# Patient Record
Sex: Male | Born: 2000 | Race: White | Hispanic: No | Marital: Single | Smoking: Never smoker
Health system: Southern US, Community
[De-identification: ages and names within clinical notes are randomized; demographics above are authoritative.]

---

## 2020-07-07 ENCOUNTER — Emergency Department
Admission: EM | Admit: 2020-07-07 | Discharge: 2020-07-07 | Disposition: A | Discharge status: Home / Self Care | Payer: 59 | Attending: Emergency Medicine | Admitting: Emergency Medicine

## 2020-07-07 ENCOUNTER — Emergency Department: Payer: 59

## 2020-07-07 ENCOUNTER — Other Ambulatory Visit: Payer: Self-pay

## 2020-07-07 DIAGNOSIS — M25512 Pain in left shoulder: Secondary | ICD-10-CM | POA: Insufficient documentation

## 2020-07-07 LAB — COMPREHENSIVE METABOLIC PANEL
ALT: 22 U/L (ref 0–44)
AST: 24 U/L (ref 15–41)
Albumin: 4.6 g/dL (ref 3.5–5.0)
Alkaline Phosphatase: 80 U/L (ref 38–126)
Anion gap: 12 (ref 5–15)
BUN: 12 mg/dL (ref 6–20)
CO2: 23 mmol/L (ref 22–32)
Calcium: 9.6 mg/dL (ref 8.9–10.3)
Chloride: 104 mmol/L (ref 98–111)
Creatinine, Ser: 0.91 mg/dL (ref 0.61–1.24)
GFR, Estimated: >60 mL/min (ref >60)
Glucose, Bld: 124 mg/dL — ABNORMAL HIGH (ref 70–99)
Potassium: 3.7 mmol/L (ref 3.5–5.1)
Sodium: 139 mmol/L (ref 135–145)
Total Bilirubin: 1 mg/dL (ref 0.3–1.2)
Total Protein: 7.6 g/dL (ref 6.5–8.1)

## 2020-07-07 LAB — CBC WITH DIFFERENTIAL/PLATELET
Abs Immature Granulocytes: 0.03 10*3/uL (ref 0.00–0.07)
Basophils Absolute: 0.1 10*3/uL (ref 0.0–0.1)
Basophils Relative: 1 %
Eosinophils Absolute: 0.8 10*3/uL — ABNORMAL HIGH (ref 0.0–0.5)
Eosinophils Relative: 11 %
HCT: 45 % (ref 39.0–52.0)
Hemoglobin: 16.1 g/dL (ref 13.0–17.0)
Immature Granulocytes: 0 %
Lymphocytes Relative: 34 %
Lymphs Abs: 2.3 10*3/uL (ref 0.7–4.0)
MCH: 30.9 pg (ref 26.0–34.0)
MCHC: 35.8 g/dL (ref 30.0–36.0)
MCV: 86.4 fL (ref 80.0–100.0)
Monocytes Absolute: 0.6 10*3/uL (ref 0.1–1.0)
Monocytes Relative: 9 %
Neutro Abs: 3 10*3/uL (ref 1.7–7.7)
Neutrophils Relative %: 45 %
Platelets: 203 10*3/uL (ref 150–400)
RBC: 5.21 MIL/uL (ref 4.22–5.81)
RDW: 12.5 % (ref 11.5–15.5)
WBC: 6.8 10*3/uL (ref 4.0–10.5)
nRBC: 0 % (ref 0.0–0.2)

## 2020-07-07 LAB — TROPONIN I (HIGH SENSITIVITY): Troponin I (High Sensitivity): <2 ng/L (ref <18)

## 2020-07-07 NOTE — ED Triage Notes (Signed)
Pt c/o left shoulder pain radiating into upper back that started today. Pt denies chest pain, sob, or dizziness associated with pain. Pt states he knew this was a sign of a heart attack and wanted to be seen. Pt denies any cardiac hx. Pt denies injury to shoulder

## 2020-07-07 NOTE — ED Provider Notes (Signed)
[facility] Emergency Department Provider Note   ____________________________________________   Event Date/Time   First MD Initiated Contact with Patient 07/07/20 1925     (approximate)  I have reviewed the triage vital signs and the nursing notes.   HISTORY  Chief Complaint Shoulder Pain    HPI Hunter Williamson is a 20 y.o. male with no stated past medical history who presents for left shoulder pain that began when he awoke this morning.  Patient describes 3/10 aching pain to the left upper arm and shoulder that does not radiate and has no exacerbating or relieving factors.  Patient specifically states that this pain does not worsen with any movement at the shoulder or lifting.  Patient currently denies any vision changes, tinnitus, difficulty speaking, facial droop, sore throat, chest pain, shortness of breath, abdominal pain, nausea/vomiting/diarrhea, dysuria, or weakness/numbness/paresthesias in any extremity         History reviewed. No pertinent past medical history.  There are no problems to display for this patient.   History reviewed. No pertinent surgical history.  Prior to Admission medications   Not on File    Allergies Patient has no allergy information on record.  No family history on file.  Social History Social History   Tobacco Use  . Smoking status: Never Smoker  . Smokeless tobacco: Never Used  Substance Use Topics  . Alcohol use: Yes  . Drug use: Not Currently    Review of Systems Constitutional: No fever/chills Eyes: No visual changes. ENT: No sore throat. Cardiovascular: Denies chest pain. Respiratory: Denies shortness of breath. Gastrointestinal: No abdominal pain.  No nausea, no vomiting.  No diarrhea. Genitourinary: Negative for dysuria. Musculoskeletal: Positive for acute left shoulder and arm pain Skin: Negative for rash. Neurological: Negative for headaches, weakness/numbness/paresthesias in any  extremity Psychiatric: Negative for suicidal ideation/homicidal ideation   ____________________________________________   PHYSICAL EXAM:  VITAL SIGNS: ED Triage Vitals  Enc Vitals Group     BP 07/07/20 1924 135/90     Pulse Rate 07/07/20 1924 89     Resp 07/07/20 1924 18     Temp 07/07/20 1924 97.8 F (36.6 C)     Temp Source 07/07/20 1924 Oral     SpO2 07/07/20 1924 100 %     Weight 07/07/20 1925 160 lb (72.6 kg)     Height 07/07/20 1925 6' (1.829 m)     Head Circumference --      Peak Flow --      Pain Score 07/07/20 1925 3     Pain Loc --      Pain Edu? --      Excl. in GC? --    Constitutional: Alert and oriented. Well appearing and in no acute distress. Eyes: Conjunctivae are normal. PERRL. Head: Atraumatic. Nose: No congestion/rhinnorhea. Mouth/Throat: Mucous membranes are moist. Neck: No stridor Cardiovascular: Grossly normal heart sounds.  Good peripheral circulation. Respiratory: Normal respiratory effort.  No retractions. Gastrointestinal: Soft and nontender. No distention. Musculoskeletal: No obvious deformities.  Full range of motion without pain at the left shoulder Neurologic:  Normal speech and language. No gross focal neurologic deficits are appreciated. Skin:  Skin is warm and dry. No rash noted. Psychiatric: Mood and affect are normal. Speech and behavior are normal.  ____________________________________________   LABS (all labs ordered are listed, but only abnormal results are displayed)  Labs Reviewed  COMPREHENSIVE METABOLIC PANEL - Abnormal; Notable for the following components:      Result Value  Glucose, Bld 124 (*)    All other components within normal limits  CBC WITH DIFFERENTIAL/PLATELET - Abnormal; Notable for the following components:   Eosinophils Absolute 0.8 (*)    All other components within normal limits  TROPONIN I (HIGH SENSITIVITY)   ____________________________________________  EKG  ED ECG REPORT I, Merwyn Katos,  the attending physician, personally viewed and interpreted this ECG.  Date: 07/07/2020 EKG Time: 1924 Rate: 88 Rhythm: normal sinus rhythm QRS Axis: normal Intervals: normal ST/T Wave abnormalities: normal Narrative Interpretation: no evidence of acute ischemia  ____________________________________________  RADIOLOGY  ED MD interpretation: One-view portable chest x-ray shows no evidence of acute abnormalities including no pneumonia, pneumothorax, or widened mediastinum  Official radiology report(s): No results found.  ____________________________________________   PROCEDURES  Procedure(s) performed (including Critical Care):  Procedures   ____________________________________________   INITIAL IMPRESSION / ASSESSMENT AND PLAN / ED COURSE  As part of my medical decision making, I reviewed the following data within the electronic MEDICAL RECORD NUMBER Nursing notes reviewed and incorporated, Labs reviewed, EKG interpreted, Old chart reviewed, Radiograph reviewed and Notes from prior ED visits reviewed and incorporated        20 year old male presents for left shoulder pain Given history, exam and workup I have low suspicion for fracture, dislocation, significant ligamentous injury, septic arthritis, gout flare, new autoimmune arthropathy, or gonococcal arthropathy.  Interventions: One-view portable chest x-ray Disposition: Discharge home with strict return precautions and instructions for prompt primary care follow up in the next week.      ____________________________________________   FINAL CLINICAL IMPRESSION(S) / ED DIAGNOSES  Final diagnoses:  Acute pain of left shoulder     ED Discharge Orders    None       Note:  This document was prepared using Dragon voice recognition software and may include unintentional dictation errors.   Merwyn Katos, MD 07/12/20 [id number]

## 2021-11-21 ENCOUNTER — Other Ambulatory Visit: Payer: Self-pay

## 2021-11-21 ENCOUNTER — Emergency Department
Admission: EM | Admit: 2021-11-21 | Discharge: 2021-11-21 | Disposition: A | Discharge status: Home / Self Care | Payer: 59 | Attending: Emergency Medicine | Admitting: Emergency Medicine

## 2021-11-21 ENCOUNTER — Encounter: Payer: Self-pay | Admitting: Emergency Medicine

## 2021-11-21 DIAGNOSIS — S61411A Laceration without foreign body of right hand, initial encounter: Secondary | ICD-10-CM | POA: Diagnosis not present

## 2021-11-21 DIAGNOSIS — W268XXA Contact with other sharp object(s), not elsewhere classified, initial encounter: Secondary | ICD-10-CM | POA: Insufficient documentation

## 2021-11-21 DIAGNOSIS — S6991XA Unspecified injury of right wrist, hand and finger(s), initial encounter: Secondary | ICD-10-CM | POA: Diagnosis present

## 2021-11-21 MED ORDER — CEPHALEXIN 500 MG PO CAPS: 1000.0000 mg | ORAL_CAPSULE | Freq: Two times a day (BID) | ORAL | 0 refills | Status: AC

## 2021-11-21 MED ORDER — LIDOCAINE-EPINEPHRINE (PF) 2 %-1:200000 IJ SOLN
10.0000 mL | Freq: Once | INTRAMUSCULAR | Status: AC
  Administered 2021-11-21: 10 mL
  Filled 2021-11-21: qty 20

## 2021-11-21 NOTE — ED Provider Notes (Signed)
[facility] Provider Note    Event Date/Time   First MD Initiated Contact with Patient 11/21/21 1527     (approximate)   History   Chief Complaint Laceration   HPI Hunter Williamson is a 21 y.o. male, no significant medical history, presents emergency department for evaluation of laceration to the right hand.  Patient states that he was playing with a steel rod that had broken in half and accidentally cut his right hand on the end of it.  Bleeding controlled with direct pressure.  Denies any other injuries at this time.  He is up-to-date on his tetanus.   History Limitations: No limitations.        Physical Exam  Triage Vital Signs: ED Triage Vitals  Enc Vitals Group     BP 11/21/21 1405 (!) 113/56     Pulse Rate 11/21/21 1405 78     Resp 11/21/21 1405 16     Temp 11/21/21 1405 97.8 F (36.6 C)     Temp Source 11/21/21 1405 Oral     SpO2 11/21/21 1405 99 %     Weight --      Height --      Head Circumference --      Peak Flow --      Pain Score 11/21/21 1400 5     Pain Loc --      Pain Edu? --      Excl. in GC? --     Most recent vital signs: Vitals:   11/21/21 1405  BP: (!) 113/56  Pulse: 78  Resp: 16  Temp: 97.8 F (36.6 C)  SpO2: 99%    General: Awake, NAD.  Skin: Warm, dry. No rashes or lesions.  Eyes: PERRL. Conjunctivae normal.  CV: Good peripheral perfusion.  Resp: Normal effort.  Abd: Soft, non-tender. No distention.  Neuro: At baseline. No gross neurological deficits.  Musculoskeletal: Normal ROM of all extremities.   Focused Exam: 4 cm linear laceration to the palmar aspect of the right hand just over top of the second metacarpal.  No exposed tendons.  Normal range of motion of the right hand and all digits, including flexion and extension.  Sensation intact along the hand and digits.  Normal cap refill distally.  No foreign bodies present.  No pulsatile bleeding  Physical Exam    ED Results / Procedures /  Treatments  Labs (all labs ordered are listed, but only abnormal results are displayed) Labs Reviewed - No data to display   EKG N/A.   RADIOLOGY  ED Provider Interpretation: N/A.  No results found.  PROCEDURES:  Critical Care performed: A.  Marland Kitchen.Laceration Repair  Date/Time: 11/21/2021 4:32 PM  Performed by: Varney Daily, PA Authorized by: Varney Daily, PA   Consent:    Consent obtained:  Verbal   Consent given by:  Patient   Risks, benefits, and alternatives were discussed: yes     Risks discussed:  Infection, need for additional repair, nerve damage, pain, retained foreign body, tendon damage and vascular damage   Alternatives discussed:  No treatment Universal protocol:    Patient identity confirmed:  Verbally with patient Anesthesia:    Anesthesia method:  Local infiltration   Local anesthetic:  Lidocaine 2% WITH epi Laceration details:    Location:  Hand   Hand location:  R palm   Length (cm):  4   Depth (mm):  2 Pre-procedure details:    Preparation:  Patient was prepped and draped  in usual sterile fashion Exploration:    Hemostasis achieved with:  Direct pressure   Wound exploration: wound explored through full range of motion and entire depth of wound visualized     Wound extent: no foreign bodies/material noted, no muscle damage noted, no nerve damage noted, no tendon damage noted, no underlying fracture noted and no vascular damage noted   Treatment:    Area cleansed with:  Soap and water   Amount of cleaning:  Extensive   Irrigation solution:  Tap water   Irrigation volume:  3000 ml   Irrigation method:  Tap Skin repair:    Repair method:  Sutures   Suture size:  5-0   Suture material:  Nylon   Suture technique:  Simple interrupted   Number of sutures:  6 Approximation:    Approximation:  Close Repair type:    Repair type:  Simple Post-procedure details:    Dressing:  Sterile dressing and tube gauze   Procedure completion:   Tolerated well, no immediate complications     MEDICATIONS ORDERED IN ED: Medications  lidocaine-EPINEPHrine (XYLOCAINE W/EPI) 2 %-1:200000 (PF) injection 10 mL (10 mLs Infiltration Given 11/21/21 1552)     IMPRESSION / MDM / ASSESSMENT AND PLAN / ED COURSE  I reviewed the triage vital signs and the nursing notes.                              Differential diagnosis includes, but is not limited to, hand laceration, flexor tendon injury, foreign body, cellulitis,   Assessment/Plan Patient presents with 4 cm laceration to the palmar aspect of the right hand.  No signs of vascular or flexor tendon injury.  No foreign bodies.  No signs of infectious etiology.  No indications for imaging at this time.  Repaired the laceration with nonabsorbable sutures.  Patient tolerated the procedure well with no immediate complications.  Given the mechanism, will provide him with antibiotic prophylaxis with prescription for cephalexin.  Instructed him to return back to the emergency department in 7 to 10 days for suture removal.  Will discharge.  Provided the patient with anticipatory guidance, return precautions, and educational material. Encouraged the patient to return to the emergency department at any time if they begin to experience any new or worsening symptoms. Patient expressed understanding and agreed with the plan.   Patient's presentation is most consistent with acute, uncomplicated illness.       FINAL CLINICAL IMPRESSION(S) / ED DIAGNOSES   Final diagnoses:  Laceration of right hand, foreign body presence unspecified, initial encounter     Rx / DC Orders   ED Discharge Orders          Ordered    cephALEXin (KEFLEX) 500 MG capsule  2 times daily        11/21/21 1625             Note:  This document was prepared using Dragon voice recognition software and may include unintentional dictation errors.   Varney Daily, [place] 11/21/21 1633    Pilar Jarvis, MD 11/21/21  2010

## 2021-11-21 NOTE — ED Triage Notes (Signed)
Pt to ED via POV, pt states that he cut his right hand on a piece of metal. Laceration is on right hand. Laceration is actively bleeding, dressing placed on pts hand by this RN

## 2021-11-21 NOTE — Discharge Instructions (Addendum)
-  Take all of your antibiotics as prescribed.  -You may take Tylenol/ibuprofen as needed for pain.  -Please return to the emergency department in 7 to 10 days for suture removal.  You may also go to urgent care, minute clinic, or your regular doctor for suture removal.  -Avoid any excessive movements with your hand in order to prevent suture rupture.  -Return to the emergency department anytime if you begin to experience any new or worsening symptoms.

## 2021-11-30 ENCOUNTER — Ambulatory Visit (INDEPENDENT_AMBULATORY_CARE_PROVIDER_SITE_OTHER): Payer: 59 | Admitting: Adult Health

## 2021-11-30 ENCOUNTER — Encounter: Payer: Self-pay | Admitting: Adult Health

## 2021-11-30 ENCOUNTER — Other Ambulatory Visit: Payer: Self-pay

## 2021-11-30 VITALS — BP 126/64 | HR 75 | Temp 97.8°F | Ht 71.5 in | Wt 167.0 lb

## 2021-11-30 DIAGNOSIS — Z4802 Encounter for removal of sutures: Secondary | ICD-10-CM | POA: Diagnosis not present

## 2021-11-30 NOTE — Progress Notes (Signed)
[facility] [address] [place], [place] [address] Phone: [phone] Fax: [phone]   Office Visit Note  Patient Name: Hunter Williamson  Date of Birth:2001-02-13  Med Rec number [id number]  Date of Service: 11/30/2021  Patient has no known allergies.  Chief Complaint  Patient presents with   Suture / Staple Removal     Suture / Staple Removal    Patient presents with 6 interrupted sutures in the palmer surface of his right hand.  They were placed 9 days ago.  He denies any issues at this time.   Current Medication:  No outpatient encounter medications on file as of 11/30/2021.   No facility-administered encounter medications on file as of 11/30/2021.      Medical History: History reviewed. No pertinent past medical history.   Vital Signs: BP 126/64 (BP Location: Right Arm, Patient Position: Sitting)   Pulse 75   Temp 97.8 F (36.6 C) (Tympanic)   Ht 5' 11.5" (1.816 m)   Wt 167 lb (75.8 kg)   SpO2 98%   BMI 22.97 kg/m    Review of Systems  Skin:        Laceration right hand, palmer surface.  Sutures intact    Physical Exam Vitals and nursing note reviewed.  Constitutional:      Appearance: Normal appearance.  Skin:    Comments: Sutured wound to right hand palmer surface. Wound appears closed, no drainage or redness.   Neurological:     Mental Status: He is alert.     Assessment/Plan: 1. Encounter for removal of sutures 6 sutures removed.  Wound is mildly dehissed, steri strips placed at this time using benzoin.  Discussed after care, and not using his right hand (when possible) to keep the wound closed for the next week.      General Counseling: Hunter Williamson understanding of the findings of todays visit and agrees with plan of treatment. I have discussed any further diagnostic evaluation that may be needed or ordered today. We also reviewed his medications today. he has been encouraged to call the office with any questions or  concerns that should arise related to todays visit.   No orders of the defined types were placed in this encounter.   No orders of the defined types were placed in this encounter.   Time spent:20 Minutes    Johnna Acosta AGNP-C Nurse Practitioner

## 2022-01-13 ENCOUNTER — Ambulatory Visit
Admission: RE | Admit: 2022-01-13 | Discharge: 2022-01-13 | Disposition: A | Discharge status: Home / Self Care | Payer: 59 | Source: Ambulatory Visit | Attending: Emergency Medicine | Admitting: Emergency Medicine

## 2022-01-13 ENCOUNTER — Ambulatory Visit (INDEPENDENT_AMBULATORY_CARE_PROVIDER_SITE_OTHER): Payer: 59

## 2022-01-13 VITALS — BP 131/77 | HR 78 | Temp 98.4°F | Resp 17 | Ht 72.0 in | Wt 165.0 lb

## 2022-01-13 DIAGNOSIS — S9032XA Contusion of left foot, initial encounter: Secondary | ICD-10-CM

## 2022-01-13 DIAGNOSIS — R2242 Localized swelling, mass and lump, left lower limb: Secondary | ICD-10-CM | POA: Diagnosis not present

## 2022-01-13 NOTE — Discharge Instructions (Addendum)
The xray of your foot is normal.    Follow up with your primary care provider if your symptoms are not improving.

## 2022-01-13 NOTE — ED Triage Notes (Signed)
Patient to Urgent Care with complaints of a dark spot on the bottom of his left foot that appeared three days ago. Bruise present to arch of foot, reports he had been walking outside in only his socks prior to finding the area. Denies any pain, denies any known injury.

## 2022-01-13 NOTE — ED Provider Notes (Signed)
[facility]    CSN: [id number] Arrival date & time: 01/13/22  1344      History   Chief Complaint Chief Complaint  Patient presents with   Rash    Dark splotchy spot on bottom of foot. - Entered by patient    HPI Hunter Williamson is a 21 y.o. male.  Patient presents with 3-day history of discolored area on bottom of left foot.  The symptom started after he was walking outside with just socks on.  No known injury.  No pain, numbness, weakness, paresthesias, open wound, or other symptoms.  No treatment at home.  No pertinent medical history.   The history is provided by the patient.    History reviewed. No pertinent past medical history.  There are no problems to display for this patient.   History reviewed. No pertinent surgical history.     Home Medications    Prior to Admission medications   Not on File    Family History History reviewed. No pertinent family history.  Social History Social History   Tobacco Use   Smoking status: Never   Smokeless tobacco: Never  Vaping Use   Vaping Use: Never used  Substance Use Topics   Alcohol use: Yes   Drug use: Not Currently     Allergies   Patient has no known allergies.   Review of Systems Review of Systems  Constitutional:  Negative for chills and fever.  Musculoskeletal:  Negative for arthralgias and gait problem.  Skin:  Positive for color change.  Neurological:  Negative for weakness and numbness.  All other systems reviewed and are negative.    Physical Exam Triage Vital Signs ED Triage Vitals  Enc Vitals Group     BP 01/13/22 1351 131/77     Pulse Rate 01/13/22 1351 78     Resp 01/13/22 1351 17     Temp 01/13/22 1351 98.4 F (36.9 C)     Temp src --      SpO2 01/13/22 1351 96 %     Weight 01/13/22 1351 165 lb (74.8 kg)     Height 01/13/22 1351 6' (1.829 m)     Head Circumference --      Peak Flow --      Pain Score 01/13/22 1349 0     Pain Loc --      Pain Edu? --       Excl. in [place]? --    No data found.  Updated Vital Signs BP 131/77   Pulse 78   Temp 98.4 F (36.9 C)   Resp 17   Ht 6' (1.829 m)   Wt 165 lb (74.8 kg)   SpO2 96%   BMI 22.38 kg/m   Visual Acuity Right Eye Distance:   Left Eye Distance:   Bilateral Distance:    Right Eye Near:   Left Eye Near:    Bilateral Near:     Physical Exam Vitals and nursing note reviewed.  Constitutional:      General: He is not in acute distress.    Appearance: Normal appearance. He is well-developed. He is not ill-appearing.  HENT:     Mouth/Throat:     Mouth: Mucous membranes are moist.  Cardiovascular:     Rate and Rhythm: Normal rate and regular rhythm.     Heart sounds: Normal heart sounds.  Pulmonary:     Effort: Pulmonary effort is normal. No respiratory distress.     Breath sounds: Normal breath sounds.  Musculoskeletal:        General: Swelling present. No tenderness or deformity. Normal range of motion.     Cervical back: Neck supple.       Feet:  Skin:    General: Skin is warm and dry.     Capillary Refill: Capillary refill takes less than 2 seconds.     Findings: Bruising and rash present. No lesion.  Neurological:     General: No focal deficit present.     Mental Status: He is alert and oriented to person, place, and time.     Sensory: No sensory deficit.     Motor: No weakness.     Gait: Gait normal.  Psychiatric:        Mood and Affect: Mood normal.        Behavior: Behavior normal.           UC Treatments / Results  Labs (all labs ordered are listed, but only abnormal results are displayed) Labs Reviewed - No data to display  EKG   Radiology DG Foot Complete Left  Result Date: 01/13/2022 CLINICAL DATA:  Bruising and swelling along the plantar aspect of the foot. EXAM: LEFT FOOT - COMPLETE 3+ VIEW COMPARISON:  None Available. FINDINGS: There is no evidence of fracture or dislocation. There is no evidence of arthropathy or other focal bone abnormality.  Soft tissues are unremarkable. IMPRESSION: Negative. Electronically Signed   By: Paulina Fusi M.D.   On: 01/13/2022 14:34    Procedures Procedures (including critical care time)  Medications Ordered in UC Medications - No data to display  Initial Impression / Assessment and Plan / UC Course  I have reviewed the triage vital signs and the nursing notes.  Pertinent labs & imaging results that were available during my care of the patient were reviewed by me and considered in my medical decision making (see chart for details).    Contusion and swelling of left foot.  X-ray negative.  Discussed symptomatic treatment including rest, elevation, ice packs, ibuprofen.  Instructed patient to follow-up with his PCP if his symptoms are not improving.  Education provided on foot contusion.  Patient agrees to plan of care.  Final Clinical Impressions(s) / UC Diagnoses   Final diagnoses:  Localized swelling of left foot  Contusion of left foot, initial encounter     Discharge Instructions      The xray of your foot is normal.    Follow up with your primary care provider if your symptoms are not improving.        ED Prescriptions   None    PDMP not reviewed this encounter.   Mickie Bail, NP 01/13/22 [phone]

## 2022-04-13 IMAGING — DX DG CHEST 1V PORT
1 series · 1 of 1 positions shown · non-contrast
Comparison: None.

CLINICAL DATA: Left chest and shoulder pain

EXAM:
PORTABLE CHEST 1 VIEW

[chest ap]
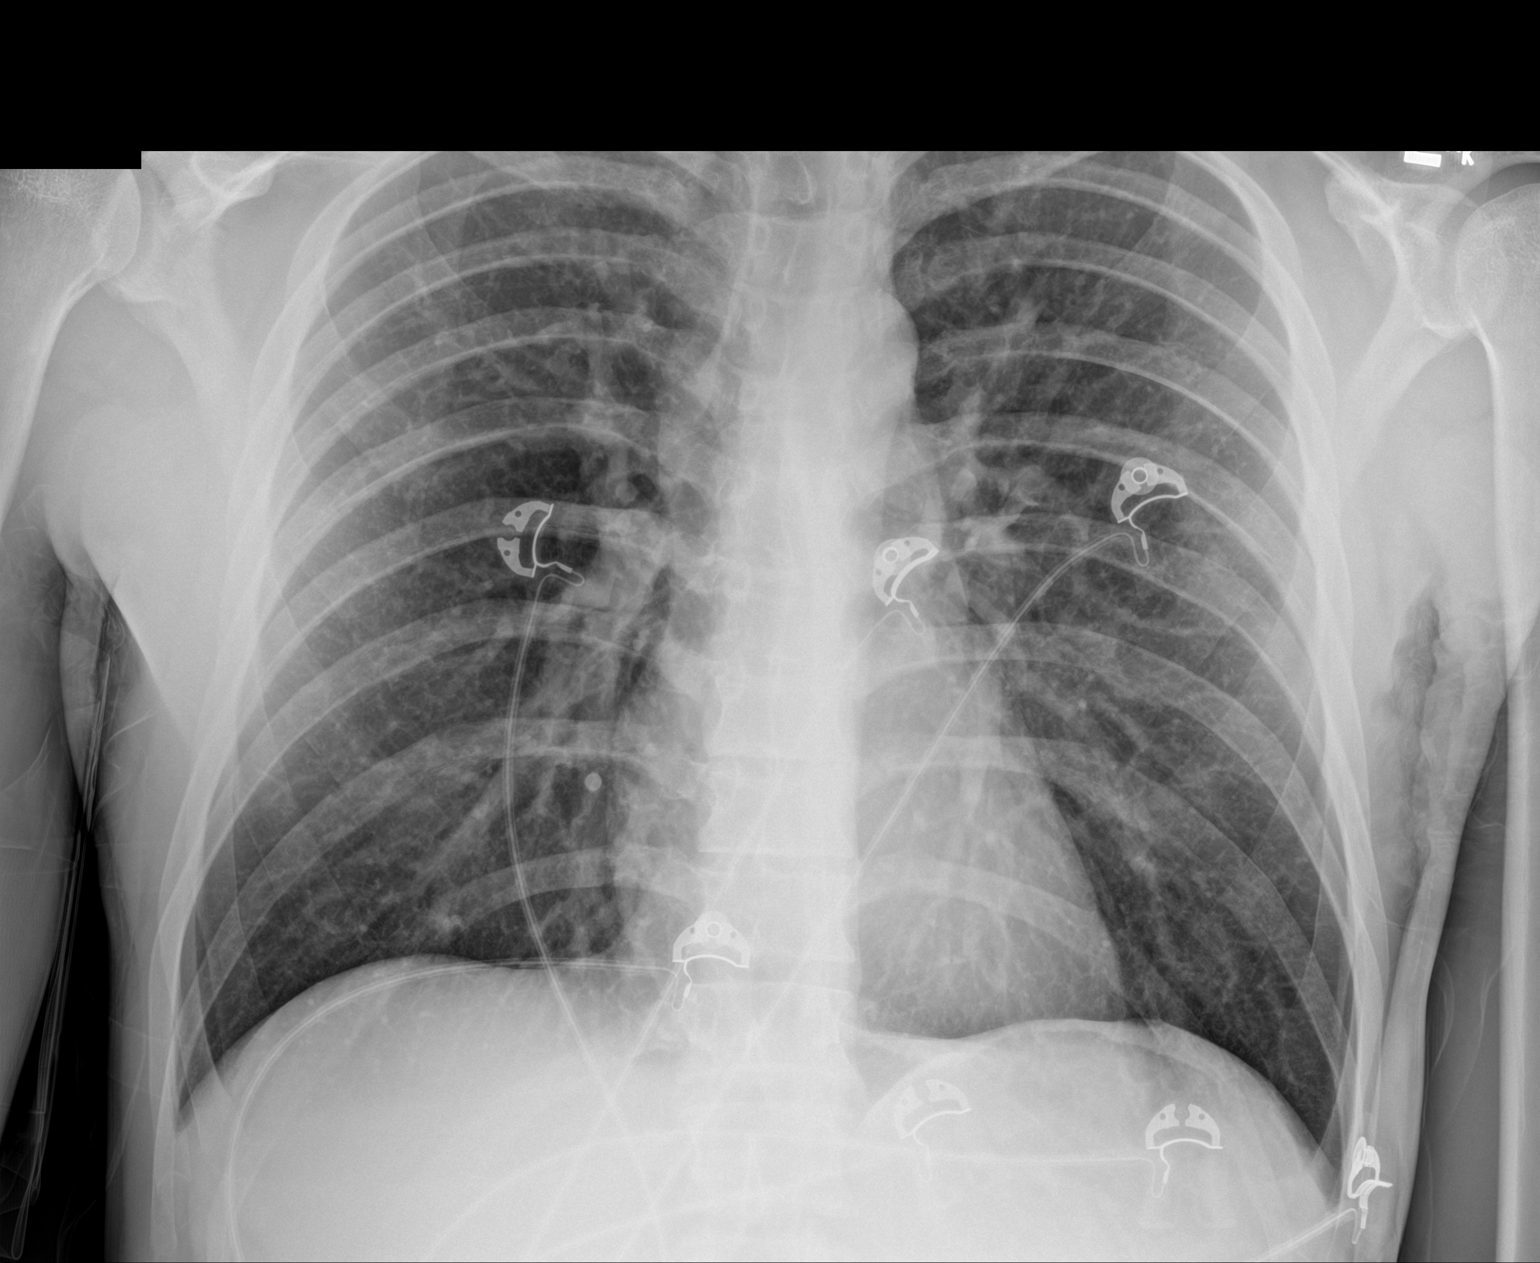

[1 of 1 positions shown; findings below may reference images not displayed]

FINDINGS: The heart size and mediastinal contours are within normal limits.
Both lungs are clear. The visualized skeletal structures are
unremarkable.
IMPRESSION: Negative.

## 2022-05-31 ENCOUNTER — Ambulatory Visit (INDEPENDENT_AMBULATORY_CARE_PROVIDER_SITE_OTHER): Payer: 59 | Admitting: Family Medicine

## 2022-05-31 ENCOUNTER — Encounter: Payer: Self-pay | Admitting: Family Medicine

## 2022-05-31 VITALS — BP 118/76 | HR 80 | Temp 97.9°F | Resp 18 | Ht 72.0 in | Wt 165.0 lb

## 2022-05-31 DIAGNOSIS — Z8709 Personal history of other diseases of the respiratory system: Secondary | ICD-10-CM

## 2022-05-31 DIAGNOSIS — B9789 Other viral agents as the cause of diseases classified elsewhere: Secondary | ICD-10-CM | POA: Diagnosis not present

## 2022-05-31 DIAGNOSIS — J988 Other specified respiratory disorders: Secondary | ICD-10-CM | POA: Diagnosis not present

## 2022-05-31 NOTE — Progress Notes (Signed)
[address] [place], [place] [address] Phone: [phone] Fax: [phone]   Office Visit Note  Patient Name: Hunter Williamson  Date of [id number]  Med Rec number [id number]  Date of Service: 05/31/2022  Patient has no known allergies.  No chief complaint on file.    Body aches, joint pains, feverish since Saturday Coughs when breathes in deeply  Has taken dayquil and Advil today and feels much better now  Had asthma when he was young      Current Medication:  No outpatient encounter medications on file as of 05/31/2022.   No facility-administered encounter medications on file as of 05/31/2022.      Medical History: No past medical history on file.   Vital Signs: There were no vitals taken for this visit.   Review of Systems  Constitutional:  Positive for chills.  Respiratory:  Positive for cough.   Musculoskeletal:  Positive for myalgias.  Neurological:  Positive for headaches.    Physical Exam Vitals reviewed.  Constitutional:      General: He is not in acute distress.    Appearance: He is not ill-appearing.  HENT:     Right Ear: Tympanic membrane normal.     Left Ear: Tympanic membrane normal.     Nose: Congestion present.     Right Sinus: No maxillary sinus tenderness or frontal sinus tenderness.     Left Sinus: No maxillary sinus tenderness or frontal sinus tenderness.     Comments: Mucosa inflamed    Mouth/Throat:     Mouth: Mucous membranes are moist.     Pharynx: Oropharynx is clear. Uvula midline. No oropharyngeal exudate or posterior oropharyngeal erythema.  Eyes:     Conjunctiva/sclera: Conjunctivae normal.  Pulmonary:     Effort: Prolonged expiration present.     Breath sounds: Normal breath sounds and air entry.  Lymphadenopathy:     Cervical: No cervical adenopathy.  Neurological:     Mental Status: He is alert.     Assessment/Plan:  1. Viral respiratory illness Continue dayquil and advil since  these are helping  2. History of asthma If cough gets worse I will send albuterol prescription to the [facility] by Kristopher Oppenheim      General Counseling: Mordecai Rasmussen understanding of the findings of todays visit and agrees with plan of treatment. I have discussed any further diagnostic evaluation that may be needed or ordered today. We also reviewed his medications today. he has been encouraged to call the office with any questions or concerns that should arise related to todays visit.   No orders of the defined types were placed in this encounter.   No orders of the defined types were placed in this encounter.    Dr Evette Doffing Gaege Sangalang [facility] University Physician
